# Patient Record
Sex: Male | Born: 1955
Health system: Southern US, Community
[De-identification: ages and names within clinical notes are randomized; demographics above are authoritative.]

## PROBLEM LIST (undated history)

## (undated) DIAGNOSIS — B181 Chronic viral hepatitis B without delta-agent: Secondary | ICD-10-CM

## (undated) DIAGNOSIS — T7840XA Allergy, unspecified, initial encounter: Secondary | ICD-10-CM

## (undated) DIAGNOSIS — G4700 Insomnia, unspecified: Secondary | ICD-10-CM

## (undated) DIAGNOSIS — E785 Hyperlipidemia, unspecified: Secondary | ICD-10-CM

## (undated) DIAGNOSIS — M545 Low back pain, unspecified: Secondary | ICD-10-CM

## (undated) DIAGNOSIS — N400 Enlarged prostate without lower urinary tract symptoms: Secondary | ICD-10-CM

## (undated) HISTORY — DX: Benign prostatic hyperplasia without lower urinary tract symptoms: N40.0

## (undated) HISTORY — DX: Hyperlipidemia, unspecified: E78.5

## (undated) HISTORY — DX: Insomnia, unspecified: G47.00

## (undated) HISTORY — DX: Low back pain, unspecified: M54.50

## (undated) HISTORY — DX: Allergy, unspecified, initial encounter: T78.40XA

## (undated) HISTORY — DX: Chronic viral hepatitis B without delta-agent: B18.1

---

## 2008-10-01 ENCOUNTER — Ambulatory Visit: Payer: Self-pay | Admitting: Gastroenterology

## 2009-04-07 ENCOUNTER — Encounter (INDEPENDENT_AMBULATORY_CARE_PROVIDER_SITE_OTHER): Payer: Self-pay | Admitting: *Deleted

## 2009-04-30 ENCOUNTER — Encounter (INDEPENDENT_AMBULATORY_CARE_PROVIDER_SITE_OTHER): Payer: Self-pay | Admitting: *Deleted

## 2009-05-04 ENCOUNTER — Ambulatory Visit: Payer: Self-pay | Admitting: Gastroenterology

## 2009-05-14 ENCOUNTER — Ambulatory Visit: Payer: Self-pay | Admitting: Gastroenterology

## 2009-05-14 HISTORY — PX: COLONOSCOPY: SHX174

## 2010-06-01 NOTE — Procedures (Signed)
Summary: Colonoscopy  Patient: [name] Note: All result statuses are Final unless otherwise noted.  Tests: (1) Colonoscopy (COL)   COL Colonoscopy           DONE     [facility]     [address]     [place], [place]  [address]           COLONOSCOPY PROCEDURE REPORT           PATIENT:  Daniel Choi, Daniel Choi  MR#:  [id number]     BIRTHDATE:  1956/02/27, 53 yrs. old  GENDER:  male           ENDOSCOPIST:  Judie Petit T. Russella Dar, MD, [facility]     Referred by:  Simone Curia, M.D.           PROCEDURE DATE:  05/14/2009     PROCEDURE:  Colonoscopy 19147     ASA CLASS:  Class I     INDICATIONS:  1) Routine Risk Screening           MEDICATIONS:   Fentanyl 50 mcg IV, Versed 7 mcg IV           DESCRIPTION OF PROCEDURE:   After the risks benefits and     alternatives of the procedure were thoroughly explained, informed     consent was obtained.  Digital rectal exam was performed and     revealed no abnormalities.   The LB PCF-H180AL [id number] endoscope     was introduced through the anus and advanced to the cecum, which     was identified by both the appendix and ileocecal valve, without     limitations.  The quality of the prep was excellent, using     MoviPrep.  The instrument was then slowly withdrawn as the colon     was fully examined.     <<PROCEDUREIMAGES>>           FINDINGS:  A normal appearing cecum, ileocecal valve, and     appendiceal orifice were identified. The ascending, hepatic     flexure, transverse, splenic flexure, descending, sigmoid colon,     and rectum appeared unremarkable. Retroflexed views in the rectum     revealed internal hemorrhoids, small.  The time to cecum =  1.75     minutes. The scope was then withdrawn (time =  9.75  min) from the     patient and the procedure completed.           COMPLICATIONS:  None           ENDOSCOPIC IMPRESSION:     1) Normal colon     2) Internal hemorrhoids           RECOMMENDATIONS:     1) Continue current colorectal screening  recommendations for     "routine risk" patients with a repeat colonoscopy in 10 years.           Venita Lick. Russella Dar, MD, [facility]           n.     eSIGNED:   Venita Lick. Chay Mazzoni at 05/14/2009 10:17 AM           Norwood Levo, [id number]  Note: An exclamation mark (!) indicates a result that was not dispersed into the flowsheet. Document Creation Date: 05/14/2009 10:17 AM _______________________________________________________________________  (1) Order result status: Final Collection or observation date-time: 05/14/2009 10:10 Requested date-time:  Receipt date-time:  Reported date-time:  Referring Physician:   Ordering Physician: Claudette Head (  [id number]) Specimen Source:  Source: Launa Grill Order Number: [id number] Lab site:   Appended Document: Colonoscopy    Clinical Lists Changes  Observations: Added new observation of COLONNXTDUE: 05/2019 (05/14/2009 11:13)

## 2010-06-01 NOTE — Miscellaneous (Signed)
Summary: DIR COL/SCRNING...AS.  Clinical Lists Changes  Medications: Added new medication of MOVIPREP 100 GM  SOLR (PEG-KCL-NACL-NASULF-NA ASC-C) As directed - Signed Rx of MOVIPREP 100 GM  SOLR (PEG-KCL-NACL-NASULF-NA ASC-C) As directed;  #1 x 0;  Signed;  Entered by: Clide Cliff RN;  Authorized by: Meryl Dare MD [facility];  Method used: Electronically to [facility] [address] [place], [place], [place]  [address], Ph: [phone] or [phone], Fax: [phone] Observations: Added new observation of NKA: T (05/04/2009 7:47)    Prescriptions: MOVIPREP 100 GM  SOLR (PEG-KCL-NACL-NASULF-NA ASC-C) As directed  #1 x 0   Entered by:   Clide Cliff RN   Authorized by:   Meryl Dare MD [facility]   Signed by:   Clide Cliff RN on 05/04/2009   Method used:   Electronically to        [facility] [address] (retail)       [address]       [place], [place]  [address]       Ph: [phone] or [phone]       Fax: [phone]   RxID:   [id number]

## 2013-12-31 ENCOUNTER — Encounter: Payer: Self-pay | Admitting: Gastroenterology

## 2016-10-04 DIAGNOSIS — E7800 Pure hypercholesterolemia, unspecified: Secondary | ICD-10-CM | POA: Diagnosis not present

## 2016-10-04 DIAGNOSIS — Z1389 Encounter for screening for other disorder: Secondary | ICD-10-CM | POA: Diagnosis not present

## 2016-10-04 DIAGNOSIS — R42 Dizziness and giddiness: Secondary | ICD-10-CM | POA: Diagnosis not present

## 2017-05-20 DIAGNOSIS — S52502A Unspecified fracture of the lower end of left radius, initial encounter for closed fracture: Secondary | ICD-10-CM | POA: Diagnosis not present

## 2017-05-22 DIAGNOSIS — E7800 Pure hypercholesterolemia, unspecified: Secondary | ICD-10-CM | POA: Diagnosis not present

## 2017-05-22 DIAGNOSIS — S62102A Fracture of unspecified carpal bone, left wrist, initial encounter for closed fracture: Secondary | ICD-10-CM | POA: Diagnosis not present

## 2017-05-22 DIAGNOSIS — S52502A Unspecified fracture of the lower end of left radius, initial encounter for closed fracture: Secondary | ICD-10-CM | POA: Diagnosis not present

## 2017-05-29 DIAGNOSIS — S52502A Unspecified fracture of the lower end of left radius, initial encounter for closed fracture: Secondary | ICD-10-CM | POA: Diagnosis not present

## 2017-06-05 DIAGNOSIS — S52502A Unspecified fracture of the lower end of left radius, initial encounter for closed fracture: Secondary | ICD-10-CM | POA: Diagnosis not present

## 2017-06-16 DIAGNOSIS — S52502A Unspecified fracture of the lower end of left radius, initial encounter for closed fracture: Secondary | ICD-10-CM | POA: Diagnosis not present

## 2017-09-02 DIAGNOSIS — Z0000 Encounter for general adult medical examination without abnormal findings: Secondary | ICD-10-CM | POA: Diagnosis not present

## 2017-09-02 DIAGNOSIS — N401 Enlarged prostate with lower urinary tract symptoms: Secondary | ICD-10-CM | POA: Diagnosis not present

## 2017-12-08 ENCOUNTER — Other Ambulatory Visit: Payer: Self-pay | Admitting: Internal Medicine

## 2017-12-08 DIAGNOSIS — R7989 Other specified abnormal findings of blood chemistry: Secondary | ICD-10-CM

## 2017-12-08 DIAGNOSIS — M545 Low back pain: Secondary | ICD-10-CM | POA: Diagnosis not present

## 2017-12-08 DIAGNOSIS — R945 Abnormal results of liver function studies: Principal | ICD-10-CM

## 2017-12-15 ENCOUNTER — Ambulatory Visit
Admission: RE | Admit: 2017-12-15 | Discharge: 2017-12-15 | Disposition: A | Discharge status: Home / Self Care | Payer: Commercial Managed Care - PPO | Source: Ambulatory Visit | Attending: Internal Medicine | Admitting: Internal Medicine

## 2017-12-15 DIAGNOSIS — R945 Abnormal results of liver function studies: Principal | ICD-10-CM

## 2017-12-15 DIAGNOSIS — R7989 Other specified abnormal findings of blood chemistry: Secondary | ICD-10-CM | POA: Diagnosis not present

## 2017-12-21 DIAGNOSIS — H43393 Other vitreous opacities, bilateral: Secondary | ICD-10-CM | POA: Diagnosis not present

## 2019-03-07 ENCOUNTER — Other Ambulatory Visit: Payer: Self-pay

## 2019-03-07 DIAGNOSIS — Z20822 Contact with and (suspected) exposure to covid-19: Secondary | ICD-10-CM

## 2019-03-09 LAB — NOVEL CORONAVIRUS, NAA: SARS-CoV-2, NAA: NOT DETECTED

## 2019-07-07 IMAGING — US US ABDOMEN LIMITED
1 series · 14 of 25 positions shown · non-contrast
Comparison: None.

CLINICAL DATA: 61-year-old male with abnormal LFTs.

EXAM:
ULTRASOUND ABDOMEN LIMITED RIGHT UPPER QUADRANT

[Series 1: us abdomen limited · 0.18mm/px · 14 of 43 slices shown]
[im 1/43]
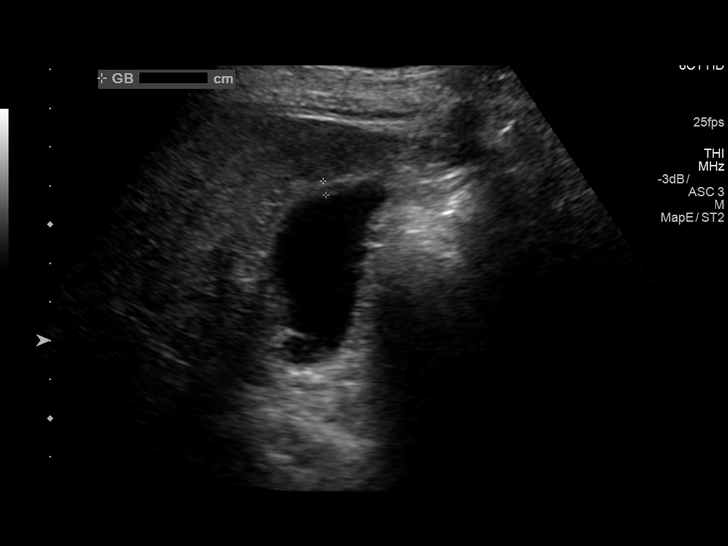
[im 4/43]
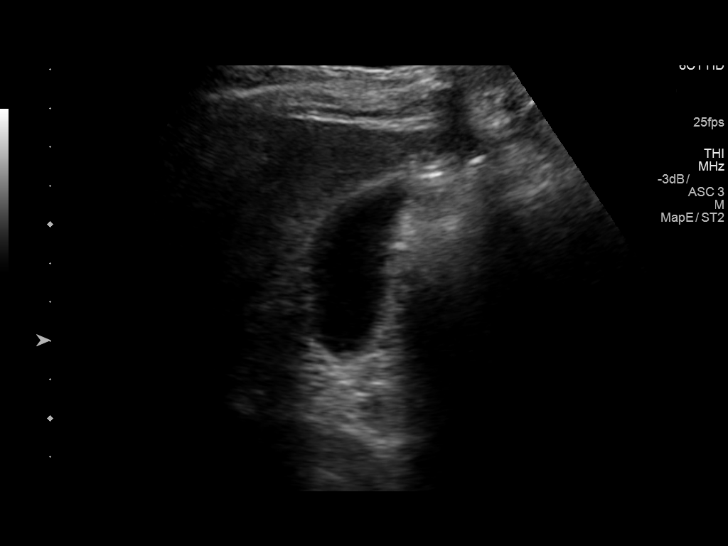
[im 8/43]
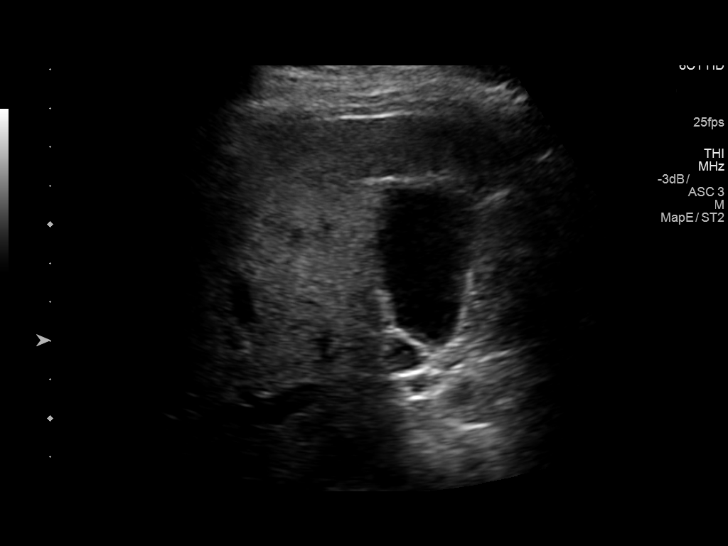
[im 11/43]
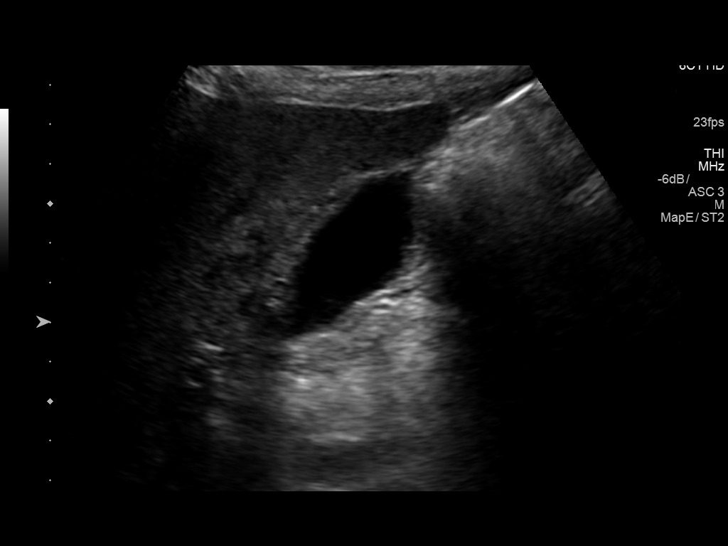
[im 15/43]
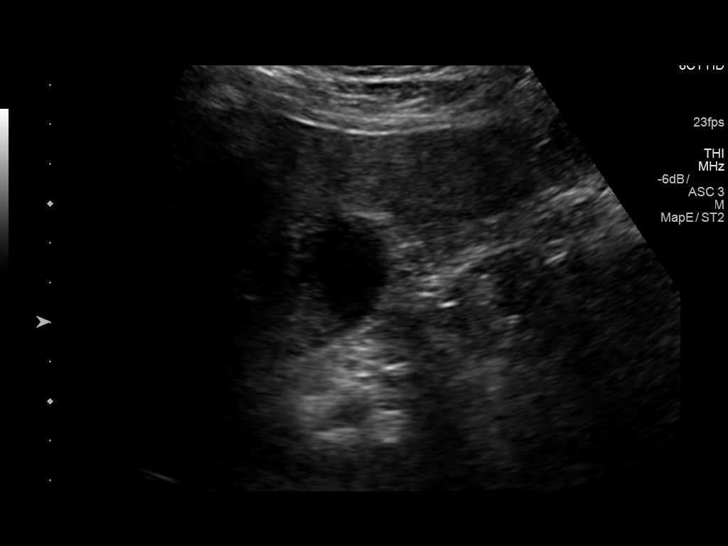
[im 16/43]
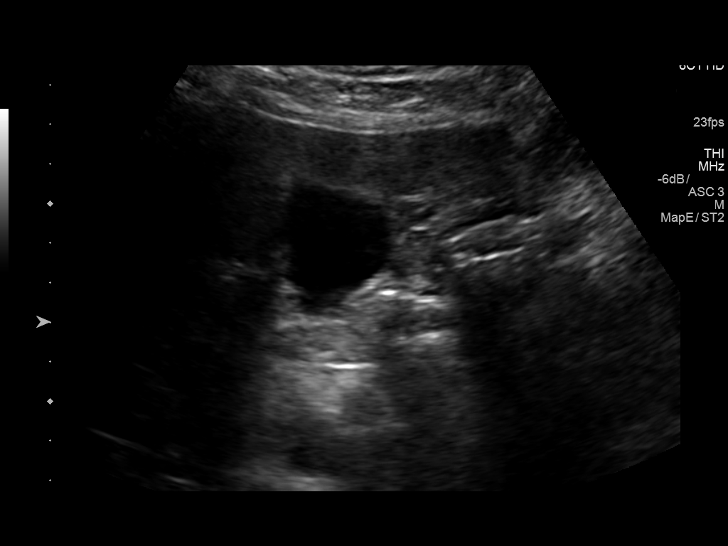
[im 20/43]
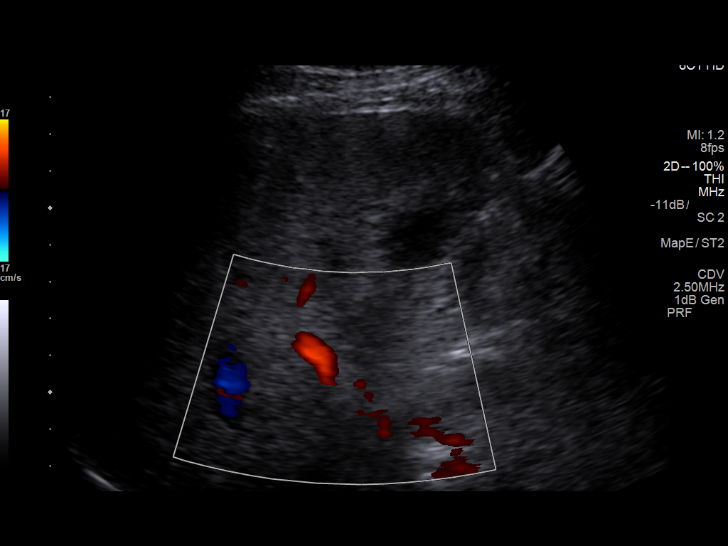
[im 23/43]
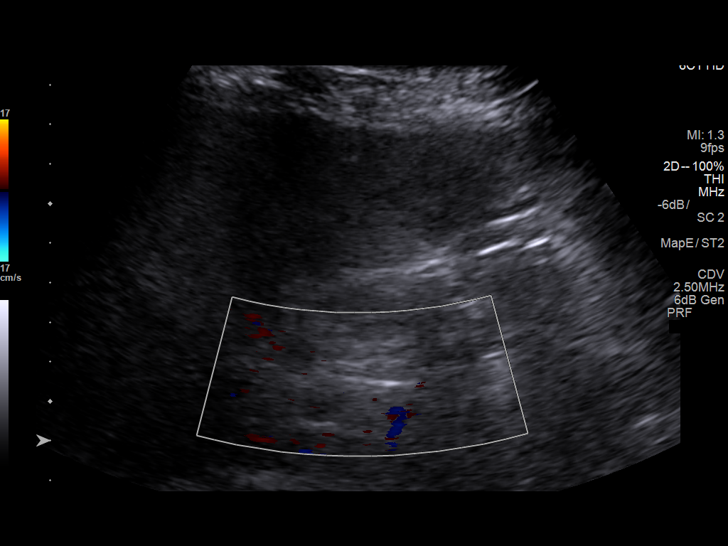
[im 27/43]
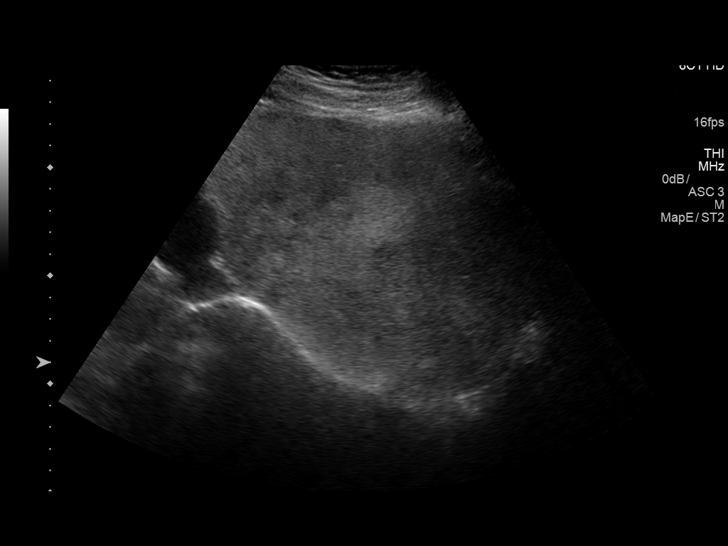
[im 29/43]
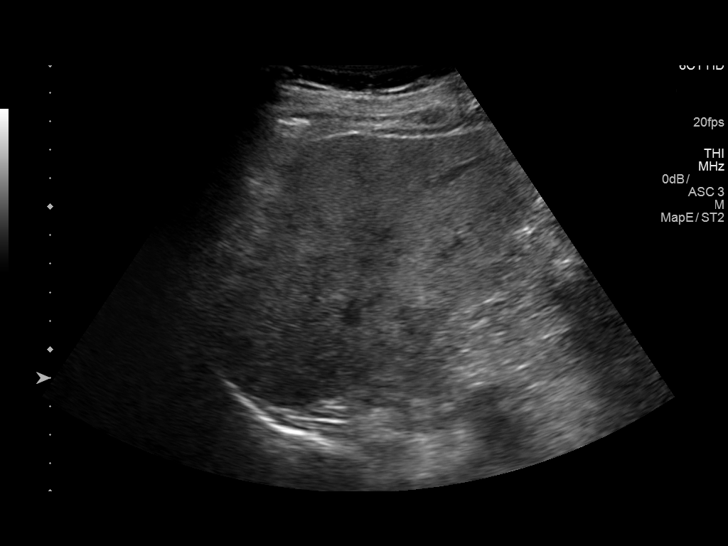
[im 32/43]
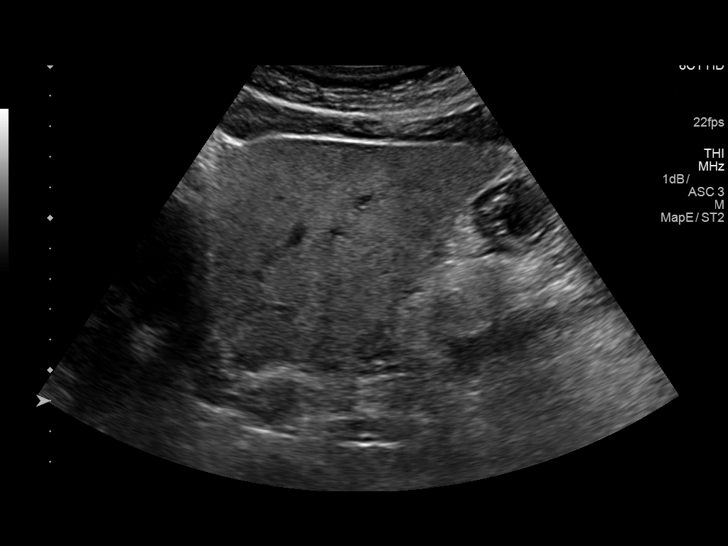
[im 36/43]
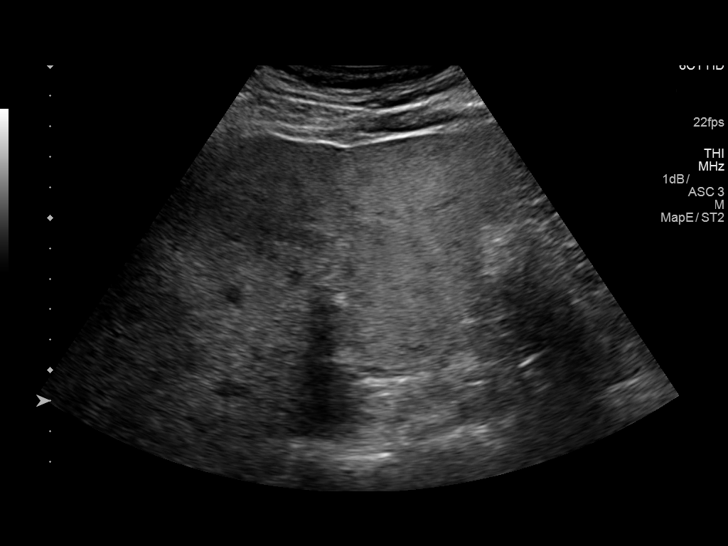
[im 39/43]
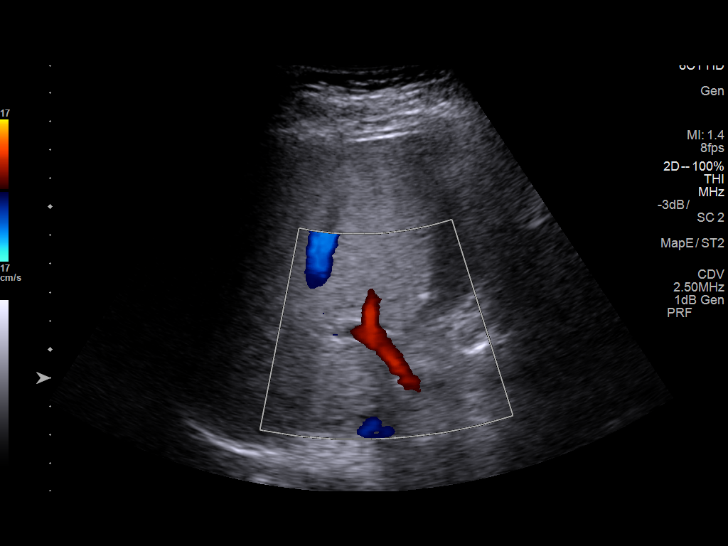
[im 43/43]
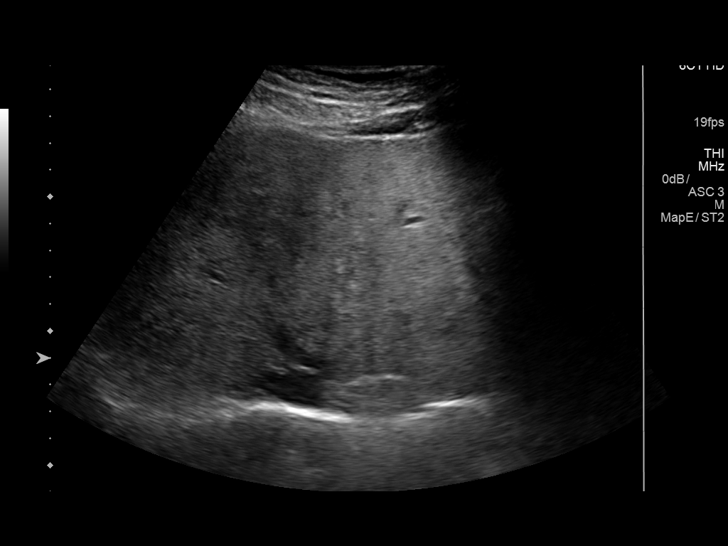

[14 of 25 positions shown; findings below may reference images not displayed]

FINDINGS: Gallbladder:

No echogenic sludge or stones identified. Gallbladder wall
thickening is at the upper limits of normal mildly increased (3-4
millimeters on image 2, but appears more normal on other images). No
pericholecystic fluid. No sonographic Murphy sign elicited.

Common bile duct:

Diameter: 4 millimeters, normal.

Liver:

Heterogeneously increased liver echogenicity (image 29). No discrete
liver lesion. No intrahepatic biliary ductal dilatation. Portal vein
is patent on color Doppler imaging with normal direction of blood
flow towards the liver.

Other findings: Negative visible right kidney.
IMPRESSION: 1. Heterogeneously increased liver echogenicity raising the
possibility of chronic hepatocellular disease and hepatic steatosis.
2. Negative for gallstones or biliary obstruction. Borderline to
mild gallbladder wall thickening may be reactive to #1.

## 2020-04-01 ENCOUNTER — Encounter: Payer: Self-pay | Admitting: Gastroenterology

## 2020-04-16 ENCOUNTER — Ambulatory Visit (AMBULATORY_SURGERY_CENTER): Payer: Self-pay | Admitting: *Deleted

## 2020-04-16 ENCOUNTER — Other Ambulatory Visit: Payer: Self-pay

## 2020-04-16 VITALS — Ht 65.5 in | Wt 151.4 lb

## 2020-04-16 DIAGNOSIS — G4700 Insomnia, unspecified: Secondary | ICD-10-CM

## 2020-04-16 DIAGNOSIS — B181 Chronic viral hepatitis B without delta-agent: Secondary | ICD-10-CM | POA: Insufficient documentation

## 2020-04-16 DIAGNOSIS — G8929 Other chronic pain: Secondary | ICD-10-CM | POA: Insufficient documentation

## 2020-04-16 DIAGNOSIS — E7800 Pure hypercholesterolemia, unspecified: Secondary | ICD-10-CM | POA: Insufficient documentation

## 2020-04-16 DIAGNOSIS — N400 Enlarged prostate without lower urinary tract symptoms: Secondary | ICD-10-CM | POA: Insufficient documentation

## 2020-04-16 DIAGNOSIS — M545 Low back pain, unspecified: Secondary | ICD-10-CM

## 2020-04-16 DIAGNOSIS — Z1211 Encounter for screening for malignant neoplasm of colon: Secondary | ICD-10-CM

## 2020-04-16 MED ORDER — PLENVU 140 G PO SOLR: 1.0000 | ORAL | 0 refills | Status: DC

## 2020-04-16 NOTE — Progress Notes (Signed)
Patient denies any allergies to egg or soy products. Patient denies complications with anesthesia/sedation.  Patient denies oxygen use at home and denies diet medications. Emmi instructions for colonoscopy offered but patient denied information.  Patient full vaccinated for covid with booster vaccination in 03/2020.

## 2020-05-08 ENCOUNTER — Encounter: Payer: Self-pay | Admitting: Gastroenterology

## 2020-05-14 ENCOUNTER — Encounter: Payer: Self-pay | Admitting: Gastroenterology

## 2020-05-14 ENCOUNTER — Other Ambulatory Visit: Payer: Self-pay

## 2020-05-14 ENCOUNTER — Ambulatory Visit (AMBULATORY_SURGERY_CENTER): Payer: Medicare HMO | Admitting: Gastroenterology

## 2020-05-14 VITALS — BP 116/75 | HR 63 | Temp 97.6°F | Resp 20 | Ht 65.5 in | Wt 151.0 lb

## 2020-05-14 DIAGNOSIS — K6389 Other specified diseases of intestine: Secondary | ICD-10-CM | POA: Diagnosis not present

## 2020-05-14 DIAGNOSIS — D123 Benign neoplasm of transverse colon: Secondary | ICD-10-CM

## 2020-05-14 DIAGNOSIS — K635 Polyp of colon: Secondary | ICD-10-CM

## 2020-05-14 DIAGNOSIS — Z1211 Encounter for screening for malignant neoplasm of colon: Secondary | ICD-10-CM

## 2020-05-14 MED ORDER — SODIUM CHLORIDE 0.9 % IV SOLN: 500.0000 mL | Freq: Once | INTRAVENOUS | Status: DC

## 2020-05-14 NOTE — Op Note (Signed)
[facility] Patient Name: Daniel Choi Procedure Date: 05/14/2020 7:35 AM MRN: [id number] Endoscopist: Ladene Artist , MD Age: 65 Referring MD:  Date of Birth: 02-Mar-1956 Gender: Male Account #: [id number] Procedure:                Colonoscopy Indications:              Screening for colorectal malignant neoplasm Medicines:                Monitored Anesthesia Care Procedure:                Pre-Anesthesia Assessment:                           - Prior to the procedure, a History and Physical                            was performed, and patient medications and                            allergies were reviewed. The patient's tolerance of                            previous anesthesia was also reviewed. The risks                            and benefits of the procedure and the sedation                            options and risks were discussed with the patient.                            All questions were answered, and informed consent                            was obtained. Prior Anticoagulants: The patient has                            taken no previous anticoagulant or antiplatelet                            agents. ASA Grade Assessment: II - A patient with                            mild systemic disease. After reviewing the risks                            and benefits, the patient was deemed in                            satisfactory condition to undergo the procedure.                           After obtaining informed consent, the colonoscope  was passed under direct vision. Throughout the                            procedure, the patient's blood pressure, pulse, and                            oxygen saturations were monitored continuously. The                            Olympus PCF-H190DL [id number]) Colonoscope was                            introduced through the anus and advanced to the the                            cecum, identified by  appendiceal orifice and                            ileocecal valve. The ileocecal valve, appendiceal                            orifice, and rectum were photographed. The quality                            of the bowel preparation was excellent. The                            colonoscopy was performed without difficulty. The                            patient tolerated the procedure well. Scope In: 8:24:47 AM Scope Out: 8:42:53 AM Scope Withdrawal Time: 0 hours 15 minutes 38 seconds  Total Procedure Duration: 0 hours 18 minutes 6 seconds  Findings:                 The perianal and digital rectal examinations were                            normal.                           Two sessile polyps were found in the transverse                            colon. The polyps were 6 mm in size. These polyps                            were removed with a cold snare. Resection and                            retrieval were complete.                           Internal hemorrhoids were found during  retroflexion. The hemorrhoids were moderate and                            Grade I (internal hemorrhoids that do not prolapse).                           The exam was otherwise without abnormality on                            direct and retroflexion views. Complications:            No immediate complications. Estimated blood loss:                            None. Estimated Blood Loss:     Estimated blood loss: none. Impression:               - Two 6 mm polyps in the transverse colon, removed                            with a cold snare. Resected and retrieved.                           - Internal hemorrhoids.                           - The examination was otherwise normal on direct                            and retroflexion views. Recommendation:           - Repeat colonoscopy after studies are complete for                            surveillance / screening based on pathology  results.                           - Patient has a contact number available for                            emergencies. The signs and symptoms of potential                            delayed complications were discussed with the                            patient. Return to normal activities tomorrow.                            Written discharge instructions were provided to the                            patient.                           - Resume previous diet.                           -  Continue present medications.                           - Await pathology results. Ladene Artist, MD 05/14/2020 8:46:52 AM This report has been signed electronically.

## 2020-05-14 NOTE — Progress Notes (Signed)
Called to room to assist during endoscopic procedure.  Patient ID and intended procedure confirmed with present staff. Received instructions for my participation in the procedure from the performing physician.  

## 2020-05-14 NOTE — Patient Instructions (Signed)
Information on polyps and hemorrhoids given to you today.  Await pathology results.  Resume previous diet and medications.   YOU HAD AN ENDOSCOPIC PROCEDURE TODAY AT THE [facility]:   Refer to the procedure report that was given to you for any specific questions about what was found during the examination.  If the procedure report does not answer your questions, please call your gastroenterologist to clarify.  If you requested that your care partner not be given the details of your procedure findings, then the procedure report has been included in a sealed envelope for you to review at your convenience later.  YOU SHOULD EXPECT: Some feelings of bloating in the abdomen. Passage of more gas than usual.  Walking can help get rid of the air that was put into your GI tract during the procedure and reduce the bloating. If you had a lower endoscopy (such as a colonoscopy or flexible sigmoidoscopy) you may notice spotting of blood in your stool or on the toilet paper. If you underwent a bowel prep for your procedure, you may not have a normal bowel movement for a few days.  Please Note:  You might notice some irritation and congestion in your nose or some drainage.  This is from the oxygen used during your procedure.  There is no need for concern and it should clear up in a day or so.  SYMPTOMS TO REPORT IMMEDIATELY:  Following lower endoscopy (colonoscopy or flexible sigmoidoscopy):  Excessive amounts of blood in the stool  Significant tenderness or worsening of abdominal pains  Swelling of the abdomen that is new, acute  Fever of 100F or higher   For urgent or emergent issues, a gastroenterologist can be reached at any hour by calling [phone]. Do not use MyChart messaging for urgent concerns.    DIET:  We do recommend a small meal at first, but then you may proceed to your regular diet.  Drink plenty of fluids but you should avoid alcoholic beverages for 24  hours.  ACTIVITY:  You should plan to take it easy for the rest of today and you should NOT DRIVE or use heavy machinery until tomorrow (because of the sedation medicines used during the test).    FOLLOW UP: Our staff will call the number listed on your records 48-72 hours following your procedure to check on you and address any questions or concerns that you may have regarding the information given to you following your procedure. If we do not reach you, we will leave a message.  We will attempt to reach you two times.  During this call, we will ask if you have developed any symptoms of COVID 19. If you develop any symptoms (ie: fever, flu-like symptoms, shortness of breath, cough etc.) before then, please call [phone].  If you test positive for Covid 19 in the 2 weeks post procedure, please call and report this information to us.    If any biopsies were taken you will be contacted by phone or by letter within the next 1-3 weeks.  Please call us at [phone] if you have not heard about the biopsies in 3 weeks.    SIGNATURES/CONFIDENTIALITY: You and/or your care partner have signed paperwork which will be entered into your electronic medical record.  These signatures attest to the fact that that the information above on your After Visit Summary has been reviewed and is understood.  Full responsibility of the confidentiality of this discharge information lies with you and/or   your care-partner. 

## 2020-05-14 NOTE — Progress Notes (Signed)
PT taken to PACU. Monitors in place. VSS. Report given to RN. 

## 2020-05-14 NOTE — Progress Notes (Signed)
Vitals-CW ?

## 2020-05-19 ENCOUNTER — Telehealth: Payer: Self-pay

## 2020-05-19 NOTE — Telephone Encounter (Signed)
Covid-19 screening questions   Do you now or have you had a fever in the last 14 days? No.  Do you have any respiratory symptoms of shortness of breath or cough now or in the last 14 days? No. Do you have any family members or close contacts with diagnosed or suspected Covid-19 in the past 14 days? No.  Have you been tested for Covid-19 and found to be positive? No.      Follow up Call-  Call back number 05/14/2020  Post procedure Call Back phone  # [phone]  Permission to leave phone message Yes  Some recent data might be hidden     Patient questions:  Do you have a fever, pain , or abdominal swelling? No. Pain Score  0 *  Have you tolerated food without any problems? Yes.    Have you been able to return to your normal activities? Yes.    Do you have any questions about your discharge instructions: Diet   No. Medications  No. Follow up visit  No.  Do you have questions or concerns about your Care? No.  Actions: * If pain score is 4 or above: No action needed, pain <4.

## 2020-05-22 ENCOUNTER — Encounter: Payer: Self-pay | Admitting: Gastroenterology

## 2020-05-26 ENCOUNTER — Telehealth: Payer: Self-pay | Admitting: Gastroenterology

## 2020-05-26 NOTE — Telephone Encounter (Signed)
Wife notified of path results and letter that was mailed out.  All questions answered.  She will call for any additional questions.

## 2020-05-28 DIAGNOSIS — Z20822 Contact with and (suspected) exposure to covid-19: Secondary | ICD-10-CM | POA: Diagnosis not present

## 2020-05-28 DIAGNOSIS — Z03818 Encounter for observation for suspected exposure to other biological agents ruled out: Secondary | ICD-10-CM | POA: Diagnosis not present

## 2020-05-30 DIAGNOSIS — Z1152 Encounter for screening for COVID-19: Secondary | ICD-10-CM | POA: Diagnosis not present

## 2020-07-08 DIAGNOSIS — H524 Presbyopia: Secondary | ICD-10-CM | POA: Diagnosis not present

## 2020-07-08 DIAGNOSIS — Z0100 Encounter for examination of eyes and vision without abnormal findings: Secondary | ICD-10-CM | POA: Diagnosis not present

## 2020-09-09 DIAGNOSIS — Z20822 Contact with and (suspected) exposure to covid-19: Secondary | ICD-10-CM | POA: Diagnosis not present

## 2020-09-09 DIAGNOSIS — R059 Cough, unspecified: Secondary | ICD-10-CM | POA: Diagnosis not present

## 2020-09-09 DIAGNOSIS — J209 Acute bronchitis, unspecified: Secondary | ICD-10-CM | POA: Diagnosis not present

## 2020-09-16 DIAGNOSIS — R030 Elevated blood-pressure reading, without diagnosis of hypertension: Secondary | ICD-10-CM | POA: Diagnosis not present

## 2020-09-16 DIAGNOSIS — J302 Other seasonal allergic rhinitis: Secondary | ICD-10-CM | POA: Diagnosis not present

## 2020-11-24 DIAGNOSIS — Z0184 Encounter for antibody response examination: Secondary | ICD-10-CM | POA: Diagnosis not present

## 2021-05-28 DIAGNOSIS — I10 Essential (primary) hypertension: Secondary | ICD-10-CM | POA: Diagnosis not present

## 2021-05-28 DIAGNOSIS — Z20822 Contact with and (suspected) exposure to covid-19: Secondary | ICD-10-CM | POA: Diagnosis not present

## 2021-05-28 DIAGNOSIS — B349 Viral infection, unspecified: Secondary | ICD-10-CM | POA: Diagnosis not present

## 2021-05-28 DIAGNOSIS — J302 Other seasonal allergic rhinitis: Secondary | ICD-10-CM | POA: Diagnosis not present

## 2021-05-28 DIAGNOSIS — G4700 Insomnia, unspecified: Secondary | ICD-10-CM | POA: Diagnosis not present

## 2021-05-29 DIAGNOSIS — I10 Essential (primary) hypertension: Secondary | ICD-10-CM | POA: Diagnosis not present

## 2021-06-25 DIAGNOSIS — G4700 Insomnia, unspecified: Secondary | ICD-10-CM | POA: Diagnosis not present

## 2021-06-25 DIAGNOSIS — I10 Essential (primary) hypertension: Secondary | ICD-10-CM | POA: Diagnosis not present

## 2021-06-25 DIAGNOSIS — J302 Other seasonal allergic rhinitis: Secondary | ICD-10-CM | POA: Diagnosis not present

## 2021-10-20 DIAGNOSIS — H524 Presbyopia: Secondary | ICD-10-CM | POA: Diagnosis not present

## 2021-12-04 DIAGNOSIS — I10 Essential (primary) hypertension: Secondary | ICD-10-CM | POA: Diagnosis not present

## 2021-12-04 DIAGNOSIS — G4700 Insomnia, unspecified: Secondary | ICD-10-CM | POA: Diagnosis not present

## 2021-12-04 DIAGNOSIS — J302 Other seasonal allergic rhinitis: Secondary | ICD-10-CM | POA: Diagnosis not present

## 2021-12-11 DIAGNOSIS — E785 Hyperlipidemia, unspecified: Secondary | ICD-10-CM | POA: Diagnosis not present

## 2022-01-15 DIAGNOSIS — E785 Hyperlipidemia, unspecified: Secondary | ICD-10-CM | POA: Diagnosis not present

## 2022-02-26 DIAGNOSIS — Z125 Encounter for screening for malignant neoplasm of prostate: Secondary | ICD-10-CM | POA: Diagnosis not present

## 2022-02-26 DIAGNOSIS — I10 Essential (primary) hypertension: Secondary | ICD-10-CM | POA: Diagnosis not present

## 2022-02-26 DIAGNOSIS — R5383 Other fatigue: Secondary | ICD-10-CM | POA: Diagnosis not present

## 2022-02-26 DIAGNOSIS — G4700 Insomnia, unspecified: Secondary | ICD-10-CM | POA: Diagnosis not present

## 2022-02-26 DIAGNOSIS — Z0000 Encounter for general adult medical examination without abnormal findings: Secondary | ICD-10-CM | POA: Diagnosis not present

## 2022-02-26 DIAGNOSIS — E785 Hyperlipidemia, unspecified: Secondary | ICD-10-CM | POA: Diagnosis not present

## 2022-04-11 DIAGNOSIS — R600 Localized edema: Secondary | ICD-10-CM | POA: Diagnosis not present

## 2022-04-16 DIAGNOSIS — M7989 Other specified soft tissue disorders: Secondary | ICD-10-CM | POA: Diagnosis not present

## 2022-04-16 DIAGNOSIS — E785 Hyperlipidemia, unspecified: Secondary | ICD-10-CM | POA: Diagnosis not present

## 2022-04-16 DIAGNOSIS — G4700 Insomnia, unspecified: Secondary | ICD-10-CM | POA: Diagnosis not present

## 2022-04-16 DIAGNOSIS — I10 Essential (primary) hypertension: Secondary | ICD-10-CM | POA: Diagnosis not present

## 2022-04-16 DIAGNOSIS — R600 Localized edema: Secondary | ICD-10-CM | POA: Diagnosis not present

## 2022-06-18 DIAGNOSIS — G4700 Insomnia, unspecified: Secondary | ICD-10-CM | POA: Diagnosis not present

## 2022-06-18 DIAGNOSIS — I10 Essential (primary) hypertension: Secondary | ICD-10-CM | POA: Diagnosis not present

## 2022-06-18 DIAGNOSIS — E785 Hyperlipidemia, unspecified: Secondary | ICD-10-CM | POA: Diagnosis not present

## 2022-07-25 DIAGNOSIS — H5213 Myopia, bilateral: Secondary | ICD-10-CM | POA: Diagnosis not present

## 2022-08-27 DIAGNOSIS — J029 Acute pharyngitis, unspecified: Secondary | ICD-10-CM | POA: Diagnosis not present

## 2022-08-27 DIAGNOSIS — I10 Essential (primary) hypertension: Secondary | ICD-10-CM | POA: Diagnosis not present

## 2022-08-27 DIAGNOSIS — E785 Hyperlipidemia, unspecified: Secondary | ICD-10-CM | POA: Diagnosis not present

## 2022-08-27 DIAGNOSIS — G4700 Insomnia, unspecified: Secondary | ICD-10-CM | POA: Diagnosis not present

## 2022-09-19 DIAGNOSIS — H25013 Cortical age-related cataract, bilateral: Secondary | ICD-10-CM | POA: Diagnosis not present

## 2022-10-08 DIAGNOSIS — I10 Essential (primary) hypertension: Secondary | ICD-10-CM | POA: Diagnosis not present

## 2022-10-08 DIAGNOSIS — J302 Other seasonal allergic rhinitis: Secondary | ICD-10-CM | POA: Diagnosis not present

## 2022-10-08 DIAGNOSIS — R0789 Other chest pain: Secondary | ICD-10-CM | POA: Diagnosis not present

## 2022-10-08 DIAGNOSIS — J45991 Cough variant asthma: Secondary | ICD-10-CM | POA: Diagnosis not present

## 2022-10-08 DIAGNOSIS — E785 Hyperlipidemia, unspecified: Secondary | ICD-10-CM | POA: Diagnosis not present

## 2022-10-08 DIAGNOSIS — G4700 Insomnia, unspecified: Secondary | ICD-10-CM | POA: Diagnosis not present

## 2022-10-09 DIAGNOSIS — I10 Essential (primary) hypertension: Secondary | ICD-10-CM | POA: Diagnosis not present

## 2022-10-09 DIAGNOSIS — E785 Hyperlipidemia, unspecified: Secondary | ICD-10-CM | POA: Diagnosis not present

## 2022-11-12 DIAGNOSIS — E785 Hyperlipidemia, unspecified: Secondary | ICD-10-CM | POA: Diagnosis not present

## 2022-11-12 DIAGNOSIS — Z01818 Encounter for other preprocedural examination: Secondary | ICD-10-CM | POA: Diagnosis not present

## 2022-11-12 DIAGNOSIS — I10 Essential (primary) hypertension: Secondary | ICD-10-CM | POA: Diagnosis not present

## 2022-11-12 DIAGNOSIS — G4700 Insomnia, unspecified: Secondary | ICD-10-CM | POA: Diagnosis not present

## 2022-11-25 DIAGNOSIS — H25011 Cortical age-related cataract, right eye: Secondary | ICD-10-CM | POA: Diagnosis not present

## 2022-11-25 DIAGNOSIS — H52201 Unspecified astigmatism, right eye: Secondary | ICD-10-CM | POA: Diagnosis not present

## 2022-12-01 DIAGNOSIS — H25011 Cortical age-related cataract, right eye: Secondary | ICD-10-CM | POA: Diagnosis not present

## 2022-12-09 DIAGNOSIS — J449 Chronic obstructive pulmonary disease, unspecified: Secondary | ICD-10-CM | POA: Diagnosis not present

## 2022-12-09 DIAGNOSIS — H25012 Cortical age-related cataract, left eye: Secondary | ICD-10-CM | POA: Diagnosis not present

## 2022-12-15 DIAGNOSIS — H25012 Cortical age-related cataract, left eye: Secondary | ICD-10-CM | POA: Diagnosis not present

## 2022-12-15 DIAGNOSIS — H25011 Cortical age-related cataract, right eye: Secondary | ICD-10-CM | POA: Diagnosis not present

## 2022-12-29 DIAGNOSIS — H25011 Cortical age-related cataract, right eye: Secondary | ICD-10-CM | POA: Diagnosis not present

## 2022-12-29 DIAGNOSIS — Z961 Presence of intraocular lens: Secondary | ICD-10-CM | POA: Diagnosis not present

## 2022-12-29 DIAGNOSIS — H25012 Cortical age-related cataract, left eye: Secondary | ICD-10-CM | POA: Diagnosis not present

## 2023-01-05 DIAGNOSIS — Z0100 Encounter for examination of eyes and vision without abnormal findings: Secondary | ICD-10-CM | POA: Diagnosis not present

## 2023-02-16 DIAGNOSIS — G4700 Insomnia, unspecified: Secondary | ICD-10-CM | POA: Diagnosis not present

## 2023-02-16 DIAGNOSIS — I10 Essential (primary) hypertension: Secondary | ICD-10-CM | POA: Diagnosis not present

## 2023-02-16 DIAGNOSIS — Z6826 Body mass index (BMI) 26.0-26.9, adult: Secondary | ICD-10-CM | POA: Diagnosis not present

## 2023-02-16 DIAGNOSIS — E782 Mixed hyperlipidemia: Secondary | ICD-10-CM | POA: Diagnosis not present

## 2023-04-10 DIAGNOSIS — E782 Mixed hyperlipidemia: Secondary | ICD-10-CM | POA: Diagnosis not present

## 2023-04-10 DIAGNOSIS — Z125 Encounter for screening for malignant neoplasm of prostate: Secondary | ICD-10-CM | POA: Diagnosis not present

## 2023-04-10 DIAGNOSIS — I10 Essential (primary) hypertension: Secondary | ICD-10-CM | POA: Diagnosis not present

## 2023-04-13 DIAGNOSIS — Z125 Encounter for screening for malignant neoplasm of prostate: Secondary | ICD-10-CM | POA: Diagnosis not present

## 2023-04-13 DIAGNOSIS — E782 Mixed hyperlipidemia: Secondary | ICD-10-CM | POA: Diagnosis not present

## 2023-04-13 DIAGNOSIS — G4700 Insomnia, unspecified: Secondary | ICD-10-CM | POA: Diagnosis not present

## 2023-04-13 DIAGNOSIS — J029 Acute pharyngitis, unspecified: Secondary | ICD-10-CM | POA: Diagnosis not present

## 2023-04-13 DIAGNOSIS — I10 Essential (primary) hypertension: Secondary | ICD-10-CM | POA: Diagnosis not present

## 2023-04-13 DIAGNOSIS — Z0001 Encounter for general adult medical examination with abnormal findings: Secondary | ICD-10-CM | POA: Diagnosis not present

## 2023-04-28 DIAGNOSIS — R69 Illness, unspecified: Secondary | ICD-10-CM | POA: Diagnosis not present

## 2023-06-14 DIAGNOSIS — E782 Mixed hyperlipidemia: Secondary | ICD-10-CM | POA: Diagnosis not present

## 2023-06-14 DIAGNOSIS — I10 Essential (primary) hypertension: Secondary | ICD-10-CM | POA: Diagnosis not present

## 2023-06-14 DIAGNOSIS — G4700 Insomnia, unspecified: Secondary | ICD-10-CM | POA: Diagnosis not present

## 2023-11-23 DIAGNOSIS — H5213 Myopia, bilateral: Secondary | ICD-10-CM | POA: Diagnosis not present

## 2023-11-23 DIAGNOSIS — Z0100 Encounter for examination of eyes and vision without abnormal findings: Secondary | ICD-10-CM | POA: Diagnosis not present

## 2024-01-23 DIAGNOSIS — I10 Essential (primary) hypertension: Secondary | ICD-10-CM | POA: Diagnosis not present

## 2024-01-23 DIAGNOSIS — E782 Mixed hyperlipidemia: Secondary | ICD-10-CM | POA: Diagnosis not present

## 2024-02-06 DIAGNOSIS — R6889 Other general symptoms and signs: Secondary | ICD-10-CM | POA: Diagnosis not present

## 2024-02-06 DIAGNOSIS — I10 Essential (primary) hypertension: Secondary | ICD-10-CM | POA: Diagnosis not present

## 2024-02-06 DIAGNOSIS — Z6827 Body mass index (BMI) 27.0-27.9, adult: Secondary | ICD-10-CM | POA: Diagnosis not present

## 2024-02-06 DIAGNOSIS — E782 Mixed hyperlipidemia: Secondary | ICD-10-CM | POA: Diagnosis not present

## 2024-02-06 DIAGNOSIS — G4700 Insomnia, unspecified: Secondary | ICD-10-CM | POA: Diagnosis not present

## 2024-02-06 DIAGNOSIS — Z125 Encounter for screening for malignant neoplasm of prostate: Secondary | ICD-10-CM | POA: Diagnosis not present
